# Patient Record
Sex: Male | Born: 1965 | Race: Black or African American | Hispanic: No | Marital: Married | Smoking: Former smoker
Health system: Southern US, Community
[De-identification: ages and names within clinical notes are randomized; demographics above are authoritative.]

## PROBLEM LIST (undated history)

## (undated) DIAGNOSIS — E785 Hyperlipidemia, unspecified: Secondary | ICD-10-CM

## (undated) DIAGNOSIS — I10 Essential (primary) hypertension: Secondary | ICD-10-CM

## (undated) HISTORY — PX: INGUINAL HERNIA REPAIR: SUR1180

## (undated) HISTORY — PX: HERNIA REPAIR: SHX51

---

## 2011-04-29 ENCOUNTER — Emergency Department: Payer: Self-pay | Admitting: Unknown Physician Specialty

## 2011-04-29 ENCOUNTER — Emergency Department: Payer: Self-pay | Admitting: Emergency Medicine

## 2011-05-08 ENCOUNTER — Emergency Department: Payer: Self-pay | Admitting: Emergency Medicine

## 2012-05-30 IMAGING — CR RIGHT HAND - COMPLETE 3+ VIEW
1 series · 3 of 3 positions shown · non-contrast
Comparison: none

REASON FOR EXAM: injury pain
COMMENTS:

PROCEDURE:     DXR - DXR HAND RT COMPLETE W/OBLIQUES  - April 29, 2011  [DATE]
RESULT:     There is no evidence of fracture, dislocation, or malalignment.

[Series 1: view not recorded · 0.17mm/px · 3 of 3 slices shown]
[im 1/3]
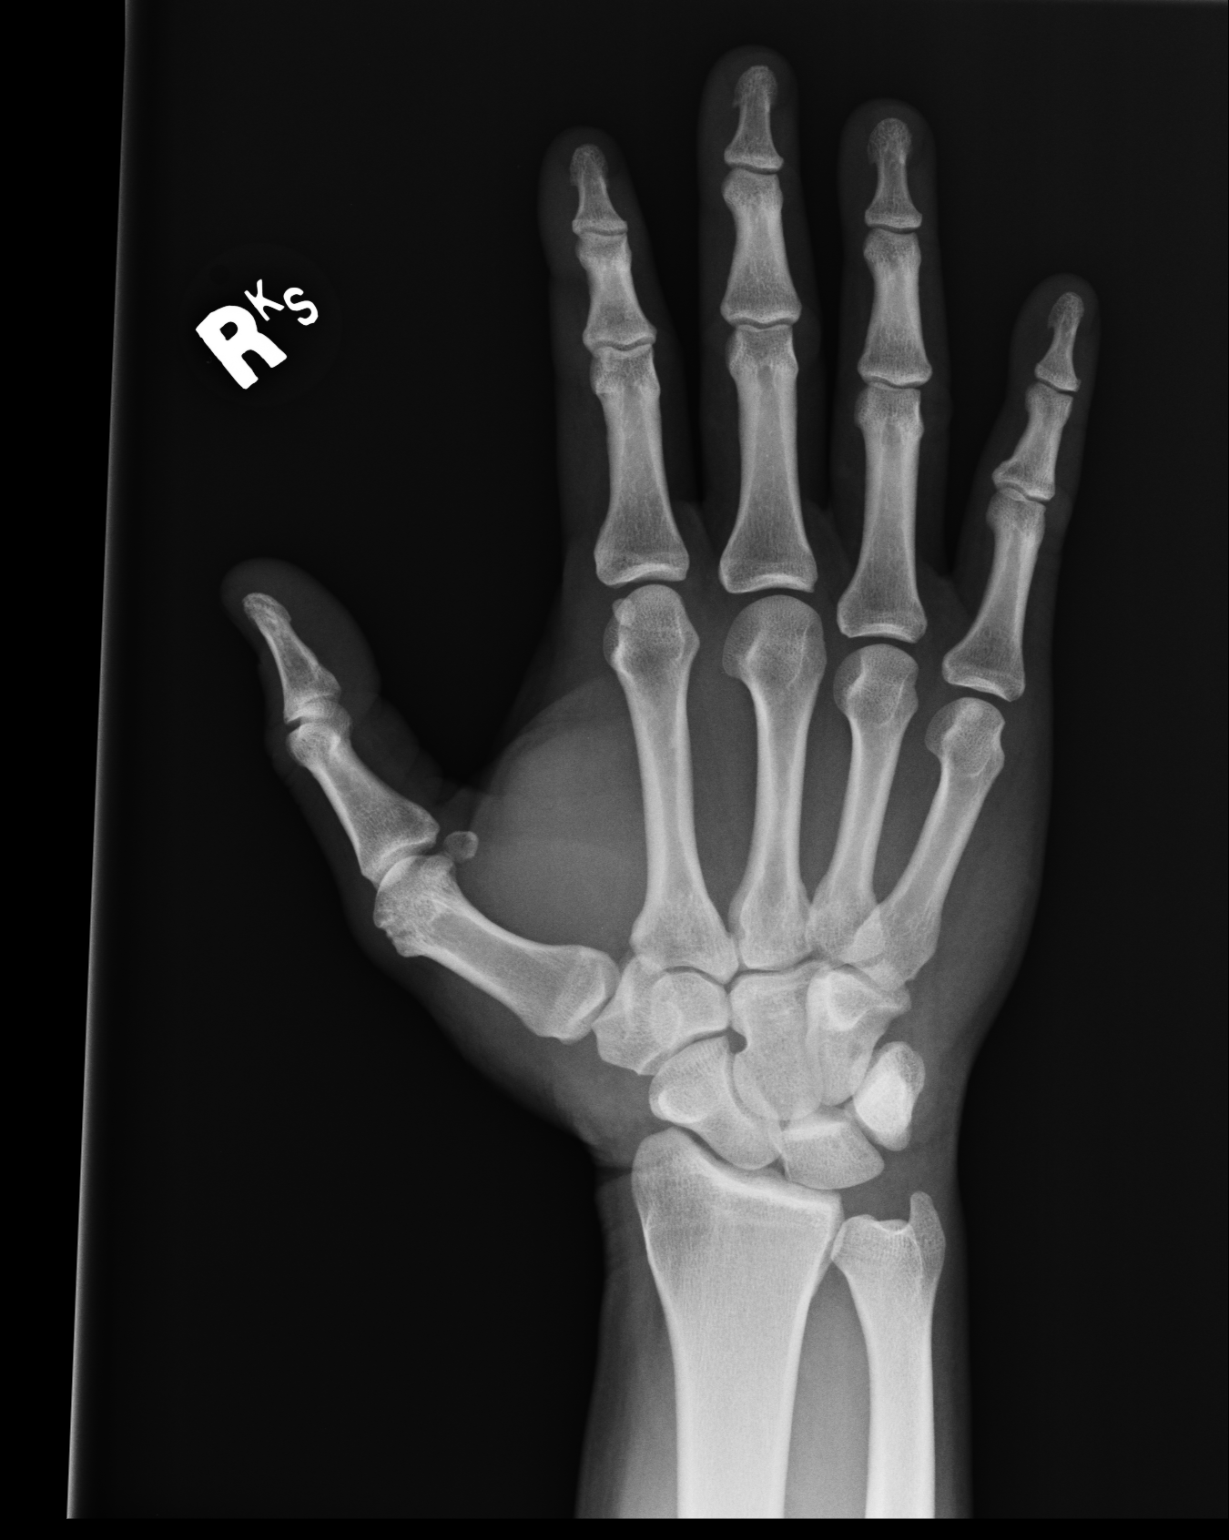
[im 2/3]
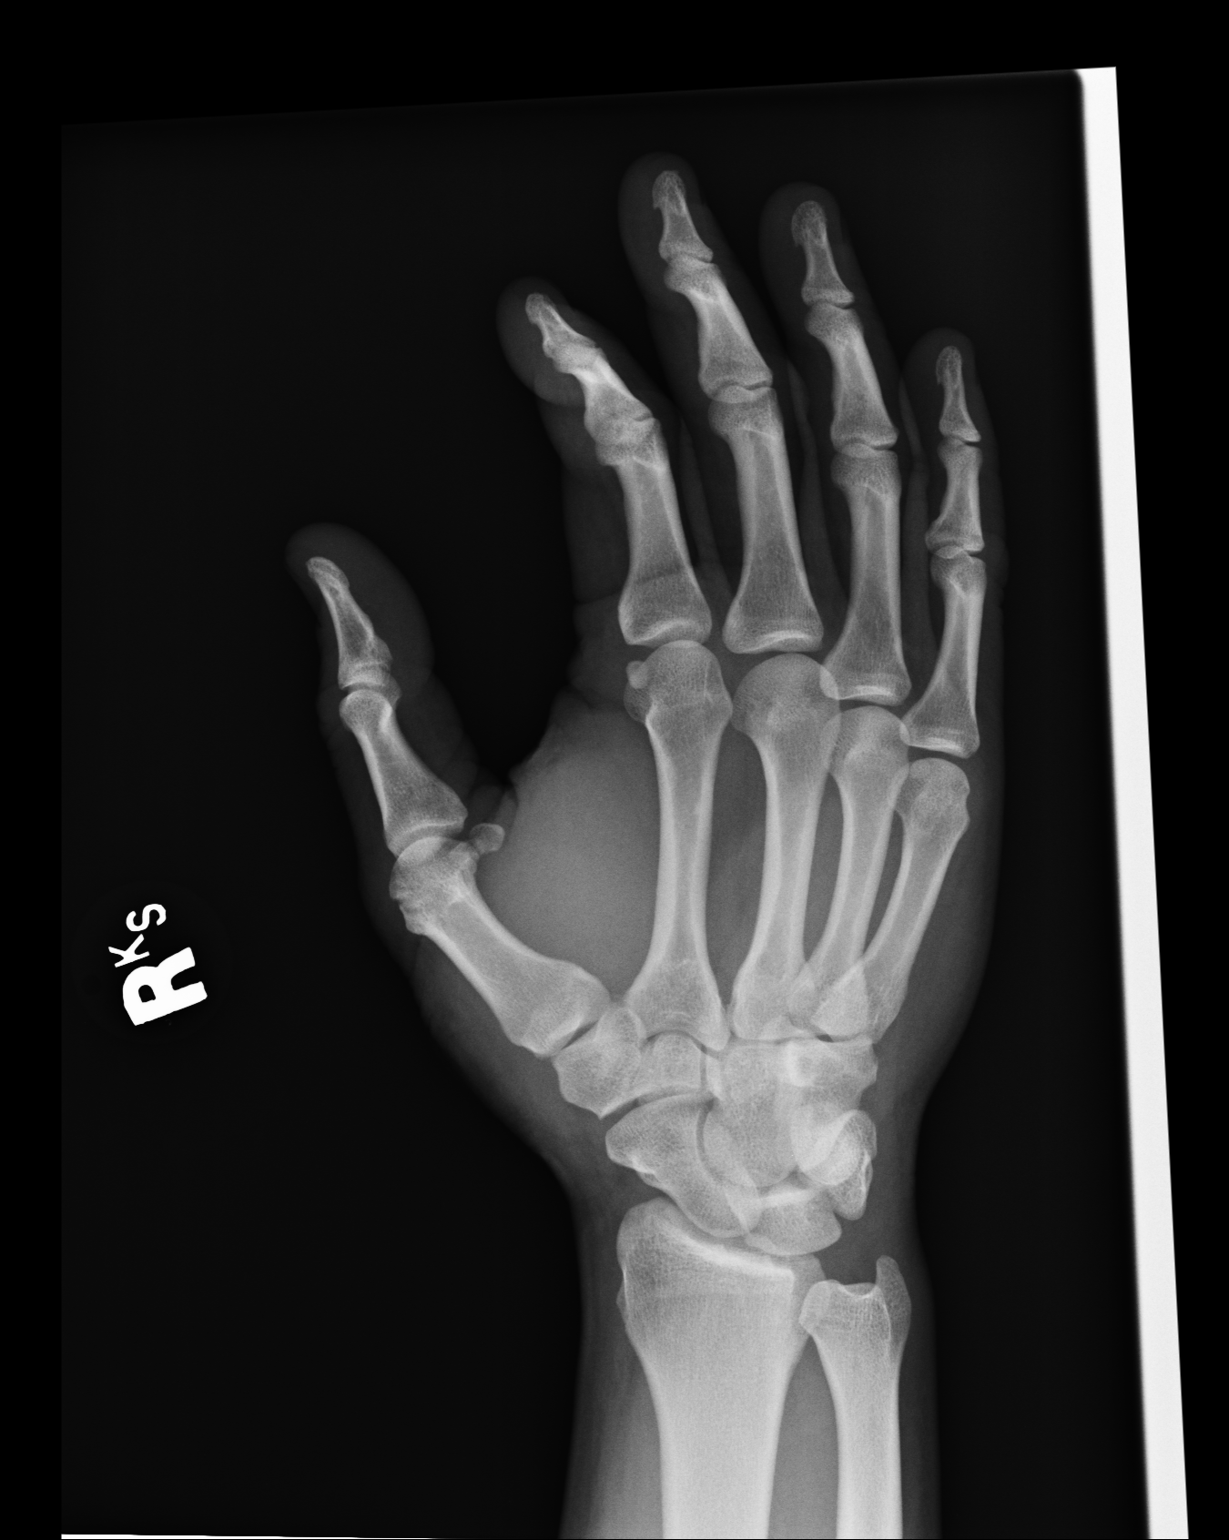
[im 3/3]
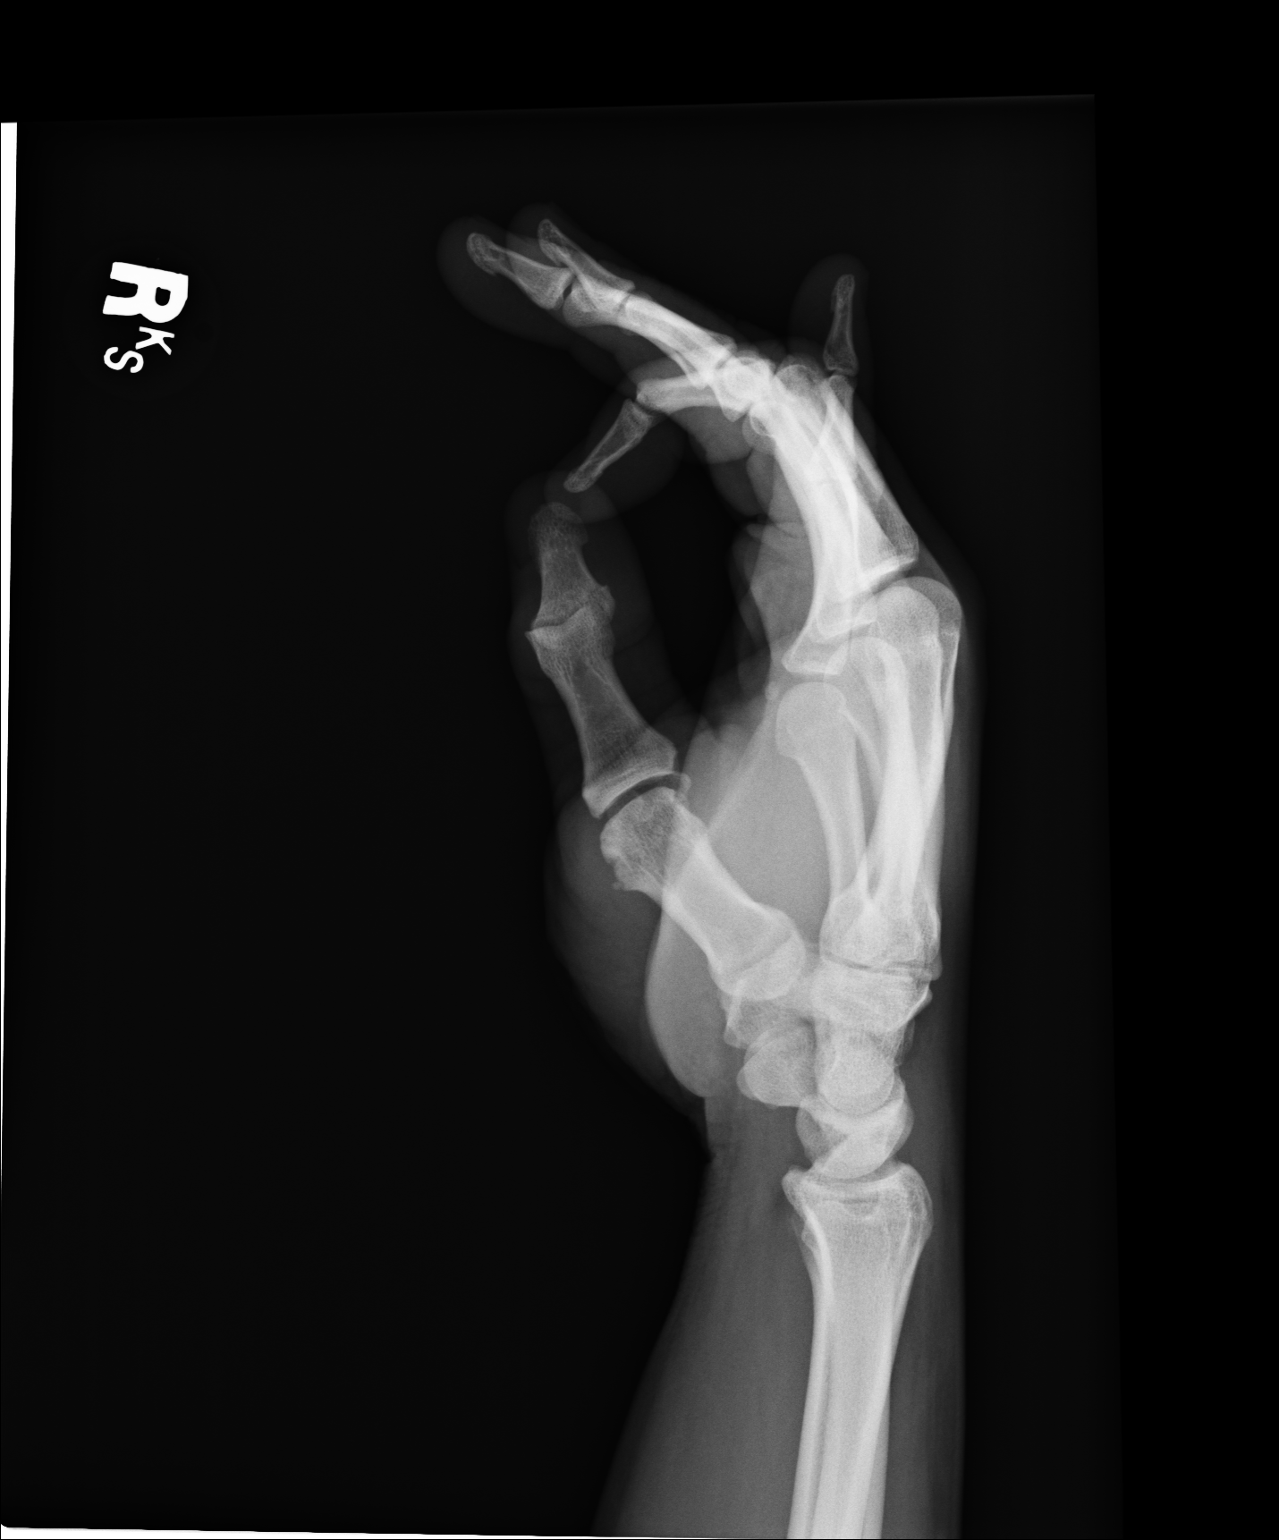

[3 of 3 positions shown; findings below may reference images not displayed]

IMPRESSION: 1. No evidence of acute abnormalities.
2. If there are persistent complaints of pain or persistent clinical
concern, a repeat evaluation in 7-10 days is recommended if clinically
warranted.

## 2013-01-01 ENCOUNTER — Ambulatory Visit: Payer: Self-pay

## 2015-03-29 ENCOUNTER — Ambulatory Visit: Payer: BLUE CROSS/BLUE SHIELD | Admitting: Anesthesiology

## 2015-03-29 ENCOUNTER — Encounter
Admission: RE | Disposition: A | Discharge status: Home Health | Payer: Self-pay | Source: Ambulatory Visit | Attending: Gastroenterology

## 2015-03-29 ENCOUNTER — Encounter: Payer: Self-pay | Admitting: *Deleted

## 2015-03-29 ENCOUNTER — Ambulatory Visit
Admission: RE | Admit: 2015-03-29 | Discharge: 2015-03-29 | Disposition: A | Discharge status: Home Health | Payer: BLUE CROSS/BLUE SHIELD | Source: Ambulatory Visit | Attending: Gastroenterology | Admitting: Gastroenterology

## 2015-03-29 DIAGNOSIS — I2510 Atherosclerotic heart disease of native coronary artery without angina pectoris: Secondary | ICD-10-CM | POA: Insufficient documentation

## 2015-03-29 DIAGNOSIS — Z79899 Other long term (current) drug therapy: Secondary | ICD-10-CM | POA: Diagnosis not present

## 2015-03-29 DIAGNOSIS — Z87891 Personal history of nicotine dependence: Secondary | ICD-10-CM | POA: Insufficient documentation

## 2015-03-29 DIAGNOSIS — K621 Rectal polyp: Secondary | ICD-10-CM | POA: Diagnosis not present

## 2015-03-29 DIAGNOSIS — D123 Benign neoplasm of transverse colon: Secondary | ICD-10-CM | POA: Diagnosis not present

## 2015-03-29 DIAGNOSIS — Z951 Presence of aortocoronary bypass graft: Secondary | ICD-10-CM | POA: Diagnosis not present

## 2015-03-29 DIAGNOSIS — I252 Old myocardial infarction: Secondary | ICD-10-CM | POA: Diagnosis not present

## 2015-03-29 DIAGNOSIS — I10 Essential (primary) hypertension: Secondary | ICD-10-CM | POA: Insufficient documentation

## 2015-03-29 DIAGNOSIS — Z1211 Encounter for screening for malignant neoplasm of colon: Secondary | ICD-10-CM | POA: Insufficient documentation

## 2015-03-29 DIAGNOSIS — D125 Benign neoplasm of sigmoid colon: Secondary | ICD-10-CM | POA: Insufficient documentation

## 2015-03-29 DIAGNOSIS — E785 Hyperlipidemia, unspecified: Secondary | ICD-10-CM | POA: Diagnosis not present

## 2015-03-29 HISTORY — DX: Hyperlipidemia, unspecified: E78.5

## 2015-03-29 HISTORY — DX: Essential (primary) hypertension: I10

## 2015-03-29 HISTORY — PX: COLONOSCOPY WITH PROPOFOL: SHX5780

## 2015-03-29 SURGERY — COLONOSCOPY WITH PROPOFOL
Anesthesia: General

## 2015-03-29 MED ORDER — PROPOFOL INFUSION 10 MG/ML OPTIME
INTRAVENOUS | Status: DC | PRN
  Administered 2015-03-29: 125 ug/kg/min via INTRAVENOUS

## 2015-03-29 MED ORDER — SODIUM CHLORIDE 0.9 % IV SOLN
INTRAVENOUS | Status: DC
  Administered 2015-03-29: 1000 mL via INTRAVENOUS

## 2015-03-29 MED ORDER — PROPOFOL 10 MG/ML IV BOLUS
INTRAVENOUS | Status: DC | PRN
  Administered 2015-03-29: 40 mg via INTRAVENOUS
  Administered 2015-03-29: 100 mg via INTRAVENOUS

## 2015-03-29 MED ORDER — SODIUM CHLORIDE 0.9 % IV SOLN: INTRAVENOUS | Status: DC

## 2015-03-29 MED ORDER — LIDOCAINE HCL (PF) 2 % IJ SOLN
INTRAMUSCULAR | Status: DC | PRN
  Administered 2015-03-29: 20 mg via INTRADERMAL

## 2015-03-29 NOTE — H&P (Signed)
Outpatient short stay form Pre-procedure 03/29/2015 10:47 AM Lollie Sails MD  Primary Physician: Dr. Adrian Prows  Reason for visit:  Screening colonoscopy  History of present illness:  Patient is a 49 year old male presenting for a screening colonoscopy. He did have a colonoscopy about 13 years ago that was apparently negative. There is no family history of colon cancer colon polyps. Tolerated his prep well. He takes no aspirin  recently. He tolerated his prep well.    Current facility-administered medications:  .  0.9 %  sodium chloride infusion, , Intravenous, Continuous, Lollie Sails, MD .  0.9 %  sodium chloride infusion, , Intravenous, Continuous, Lollie Sails, MD, Last Rate: 20 mL/hr at 03/29/15 1040, 1,000 mL at 03/29/15 1040  Prescriptions prior to admission  Medication Sig Dispense Refill Last Dose  . atenolol-chlorthalidone (TENORETIC) 50-25 MG per tablet Take 1 tablet by mouth daily.   03/29/2015 at 0600  . atorvastatin (LIPITOR) 10 MG tablet Take 10 mg by mouth daily.   03/28/2015 at 0700  . indomethacin (INDOCIN) 50 MG capsule Take 50 mg by mouth 2 (two) times daily with a meal.   03/28/2015 at 0700     No Known Allergies   Past Medical History  Diagnosis Date  . Hypertension   . Hyperlipemia     Review of systems:      Physical Exam    Heart and lungs: Regular rate and rhythm without rub or gallop, lungs are bilaterally clear    HEENT: Normal cephalic atraumatic eyes are anicteric    Other:     Pertinant exam for procedure: Soft nontender nondistended bowel sounds positive normoactive    Planned proceedures: Colonoscopy and indicated procedures. I have discussed the risks benefits and complications of procedures to include not limited to bleeding, infection, perforation and the risk of sedation and the patient wishes to proceed.    Lollie Sails, MD Gastroenterology 03/29/2015  10:47 AM

## 2015-03-29 NOTE — Transfer of Care (Signed)
Immediate Anesthesia Transfer of Care Note  Patient: Stephen Navarro  Procedure(s) Performed: Procedure(s): COLONOSCOPY WITH PROPOFOL (N/A)  Patient Location: Endoscopy Unit  Anesthesia Type:General  Level of Consciousness: awake, alert  and oriented  Airway & Oxygen Therapy: Patient Spontanous Breathing and Patient connected to nasal cannula oxygen  Post-op Assessment: Report given to RN and Post -op Vital signs reviewed and stable  Post vital signs: Reviewed and stable  Last Vitals:  Filed Vitals:   03/29/15 1027  BP: 143/78  Pulse: 63  Temp: 37.1 C  Resp: 18    Complications: No apparent anesthesia complications

## 2015-03-29 NOTE — Anesthesia Preprocedure Evaluation (Signed)
Anesthesia Evaluation  Patient identified by MRN, date of birth, ID band Patient awake    Reviewed: Allergy & Precautions, H&P , NPO status , Patient's Chart, lab work & pertinent test results, reviewed documented beta blocker date and time   History of Anesthesia Complications Negative for: history of anesthetic complications  Airway Mallampati: I  TM Distance: >3 FB Neck ROM: full    Dental no notable dental hx. (+) Teeth Intact   Pulmonary neg shortness of breath, neg sleep apnea, neg COPDneg recent URI, former smoker,  breath sounds clear to auscultation  Pulmonary exam normal       Cardiovascular Exercise Tolerance: Good hypertension, - angina- CAD, - Past MI and - CABG Normal cardiovascular exam- dysrhythmias - Valvular Problems/MurmursRhythm:regular Rate:Normal     Neuro/Psych negative neurological ROS  negative psych ROS   GI/Hepatic negative GI ROS, Neg liver ROS,   Endo/Other  negative endocrine ROS  Renal/GU negative Renal ROS  negative genitourinary   Musculoskeletal   Abdominal   Peds  Hematology negative hematology ROS (+)   Anesthesia Other Findings Past Medical History:   Hypertension                                                 Hyperlipemia                                                 Reproductive/Obstetrics negative OB ROS                             Anesthesia Physical Anesthesia Plan  ASA: II  Anesthesia Plan: General   Post-op Pain Management:    Induction:   Airway Management Planned:   Additional Equipment:   Intra-op Plan:   Post-operative Plan:   Informed Consent: I have reviewed the patients History and Physical, chart, labs and discussed the procedure including the risks, benefits and alternatives for the proposed anesthesia with the patient or authorized representative who has indicated his/her understanding and acceptance.   Dental  Advisory Given  Plan Discussed with: Anesthesiologist, CRNA and Surgeon  Anesthesia Plan Comments:         Anesthesia Quick Evaluation

## 2015-03-30 ENCOUNTER — Encounter: Payer: Self-pay | Admitting: Gastroenterology

## 2015-03-31 LAB — SURGICAL PATHOLOGY

## 2015-03-31 NOTE — Anesthesia Postprocedure Evaluation (Signed)
  Anesthesia Post-op Note  Patient: Stephen Navarro  Procedure(s) Performed: Procedure(s): COLONOSCOPY WITH PROPOFOL (N/A)  Anesthesia type:General  Patient location: PACU  Post pain: Pain level controlled  Post assessment: Post-op Vital signs reviewed, Patient's Cardiovascular Status Stable, Respiratory Function Stable, Patent Airway and No signs of Nausea or vomiting  Post vital signs: Reviewed and stable  Last Vitals:  Filed Vitals:   03/29/15 1240  BP: 120/84  Pulse: 56  Temp:   Resp: 15    Level of consciousness: awake, alert  and patient cooperative  Complications: No apparent anesthesia complications

## 2015-03-31 NOTE — Op Note (Signed)
[facility] Gastroenterology Patient Name: Stephen Navarro Procedure Date: 03/29/2015 11:34 AM MRN: [id number] Account #: [id number] Date of Birth: [birth year] Admit Type: Outpatient Age: 49 Room: [facility] ENDO ROOM 3 Gender: Male Note Status: Finalized Procedure:         Colonoscopy Indications:       Screening for colorectal malignant neoplasm Providers:         Lollie Sails, MD Referring MD:      Youlanda Roys. Ola Spurr, MD (Referring MD) Medicines:         Monitored Anesthesia Care Complications:     No immediate complications. Procedure:         Pre-Anesthesia Assessment:                    - ASA Grade Assessment: II - A patient with mild systemic                     disease.                    After obtaining informed consent, the colonoscope was                     passed under direct vision. Throughout the procedure, the                     patient's blood pressure, pulse, and oxygen saturations                     were monitored continuously. The Colonoscope was                     introduced through the anus and advanced to the the cecum,                     identified by appendiceal orifice and ileocecal valve. The                     colonoscopy was performed without difficulty. The patient                     tolerated the procedure well. The patient tolerated the                     procedure well. Findings:      A 3 mm polyp was found at the hepatic flexure. The polyp was flat. The       polyp was removed with a cold biopsy forceps. Resection and retrieval       were complete.      A less than 1 mm polyp was found in the distal sigmoid colon. The polyp       was sessile. The polyp was removed with a cold biopsy forceps. Resection       and retrieval were complete.      Four sessile polyps were found in the rectum. The polyps were 1 to 2 mm       in size. These polyps were removed with a cold biopsy forceps. Resection       and retrieval were  complete.      No additional abnormalities were found on retroflexion.      The digital rectal exam was normal. Pertinent negatives include old       fissure/scarring in the posterior anal canal. Impression:        -  One 3 mm polyp at the hepatic flexure. Resected and                     retrieved.                    - One less than 1 mm polyp in the distal sigmoid colon.                     Resected and retrieved.                    - Four 1 to 2 mm polyps in the rectum. Resected and                     retrieved. Recommendation:    - Await pathology results.                    - Telephone GI clinic for pathology results in 1 week. Procedure Code(s): --- Professional ---                    [phone], Colonoscopy, flexible; with biopsy, single or                     multiple Diagnosis Code(s): --- Professional ---                    V76.51, Special screening for malignant neoplasms of colon                    211.3, Benign neoplasm of colon                    569.0, Anal and rectal polyp CPT copyright 2014 [employer]. All rights reserved. The codes documented in this report are preliminary and upon coder review may  be revised to meet current compliance requirements. Lollie Sails, MD 03/29/2015 12:04:52 PM This report has been signed electronically. Number of Addenda: 0 Note Initiated On: 03/29/2015 11:34 AM Scope Withdrawal Time: 0 hours 9 minutes 54 seconds  Total Procedure Duration: 0 hours 19 minutes 19 seconds       [facility]
